# Patient Record
Sex: Male | Born: 2005 | Race: Black or African American | Hispanic: No | Marital: Single
Health system: Southern US, Community
[De-identification: ages and names within clinical notes are randomized; demographics above are authoritative.]

## PROBLEM LIST (undated history)

## (undated) DIAGNOSIS — Q984 Klinefelter syndrome, unspecified: Secondary | ICD-10-CM

## (undated) DIAGNOSIS — F909 Attention-deficit hyperactivity disorder, unspecified type: Secondary | ICD-10-CM

## (undated) DIAGNOSIS — R011 Cardiac murmur, unspecified: Secondary | ICD-10-CM

## (undated) DIAGNOSIS — J45909 Unspecified asthma, uncomplicated: Secondary | ICD-10-CM

## (undated) HISTORY — PX: OTHER SURGICAL HISTORY: SHX169

## (undated) HISTORY — PX: LEG AMPUTATION: SHX1105

---
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
## 2025-01-18 ENCOUNTER — Encounter (INDEPENDENT_AMBULATORY_CARE_PROVIDER_SITE_OTHER): Payer: MEDICAID
